# Patient Record
Sex: Female | Born: 1970 | Smoking: Never smoker
Health system: Southern US, Community
[De-identification: ages and names within clinical notes are randomized; demographics above are authoritative.]

## PROBLEM LIST (undated history)

## (undated) ENCOUNTER — Emergency Department (HOSPITAL_COMMUNITY): Payer: BC Managed Care – PPO | Source: Home / Self Care

## (undated) DIAGNOSIS — I341 Nonrheumatic mitral (valve) prolapse: Secondary | ICD-10-CM

## (undated) DIAGNOSIS — T7840XA Allergy, unspecified, initial encounter: Secondary | ICD-10-CM

## (undated) DIAGNOSIS — T300 Burn of unspecified body region, unspecified degree: Secondary | ICD-10-CM

## (undated) DIAGNOSIS — M1990 Unspecified osteoarthritis, unspecified site: Secondary | ICD-10-CM

## (undated) HISTORY — DX: Nonrheumatic mitral (valve) prolapse: I34.1

## (undated) HISTORY — DX: Allergy, unspecified, initial encounter: T78.40XA

## (undated) HISTORY — DX: Unspecified osteoarthritis, unspecified site: M19.90

## (undated) HISTORY — DX: Burn of unspecified body region, unspecified degree: T30.0

---

## 2012-01-25 ENCOUNTER — Ambulatory Visit (INDEPENDENT_AMBULATORY_CARE_PROVIDER_SITE_OTHER): Payer: BC Managed Care – PPO | Admitting: Internal Medicine

## 2012-01-25 VITALS — BP 122/79 | HR 73 | Temp 98.8°F | Resp 16 | Ht 65.78 in | Wt 172.2 lb

## 2012-01-25 DIAGNOSIS — J301 Allergic rhinitis due to pollen: Secondary | ICD-10-CM

## 2012-01-25 DIAGNOSIS — H00039 Abscess of eyelid unspecified eye, unspecified eyelid: Secondary | ICD-10-CM

## 2012-01-25 MED ORDER — FLUTICASONE PROPIONATE 50 MCG/ACT NA SUSP: 2.0000 | Freq: Every day | NASAL | Status: DC

## 2012-01-25 MED ORDER — CEPHALEXIN 500 MG PO CAPS: 500.0000 mg | ORAL_CAPSULE | Freq: Three times a day (TID) | ORAL | Status: AC

## 2012-01-25 NOTE — Progress Notes (Signed)
  Subjective:    Patient ID: Heather Atkinson, female    DOB: [birth year], 41 y.o.   MRN: [id number]  HPIHas a five-day history of congestion without fever/ cough/Sore throat/She does notice a fullness in the left ear History of spring allergies Has a two-day history of swollen tender left upper eyelid without discharge or redness in the eye    Review of Systems Noncontributory    Objective:   Physical Exam Vital signs normal TMs clear Nares boggy with clear rhinorrhea Throat is clear and there no anterior cervical nodes The left upper eyelid is red and swollen and tender without a distinct abscess formation or a pointing drainage       Assessment & Plan:  Problem #1 eyelid cellulitis Keflex 500 3 times a day for 10 days Hot compresses  Problem #2 allergic rhinitis Plan Flonase for 4 months

## 2012-02-03 ENCOUNTER — Telehealth: Payer: Self-pay

## 2012-02-03 NOTE — Telephone Encounter (Signed)
Pt is calling because dr Merla Riches put her cephalaxin and she is having some strange side effects would like a nurse to call her

## 2012-02-04 NOTE — Telephone Encounter (Signed)
PT STATED THAT SHE WAS HAVING SOME BREAK THRU BLEEDING AND THOUGHT IT MAY HAVE COME FROM KEFLEX.  ADVISED PT THAT IS NOT A USUAL SIDE EFFECT OF MEDICATION.  PT WILL CALL OB/GYN

## 2012-04-29 ENCOUNTER — Ambulatory Visit (INDEPENDENT_AMBULATORY_CARE_PROVIDER_SITE_OTHER): Payer: BC Managed Care – PPO | Admitting: Family Medicine

## 2012-04-29 VITALS — BP 118/82 | HR 83 | Temp 98.6°F | Resp 16 | Ht 66.75 in | Wt 162.0 lb

## 2012-04-29 DIAGNOSIS — J329 Chronic sinusitis, unspecified: Secondary | ICD-10-CM

## 2012-04-29 DIAGNOSIS — J019 Acute sinusitis, unspecified: Secondary | ICD-10-CM

## 2012-04-29 NOTE — Progress Notes (Signed)
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 He comes in with 2 days of myalgia, low-grade fever, facial pain across the malar arches. She has a history of sinus infections and this is typical for her. He's used Flonase in the past which has not helped her this time.  No cough, nausea, vomiting, or chance of pregnancy  Objective: No acute distress HEENT: Swollen nasal passages which are red and blind with mucopurulent discharge, otherwise normal Chest: Clear Heart: Regular no murmur Neck: No adenopathy, thyromegaly, stiffness Skin: Unremarkable  Assessment: Sinus infection, mild Plan: Levaquin 500 daily x7 and continue Flonase

## 2013-02-21 ENCOUNTER — Ambulatory Visit (INDEPENDENT_AMBULATORY_CARE_PROVIDER_SITE_OTHER): Payer: BC Managed Care – PPO | Admitting: Family Medicine

## 2013-02-21 VITALS — BP 108/72 | HR 94 | Temp 99.0°F | Resp 16 | Ht 67.0 in | Wt 164.0 lb

## 2013-02-21 DIAGNOSIS — T300 Burn of unspecified body region, unspecified degree: Secondary | ICD-10-CM

## 2013-02-21 HISTORY — DX: Burn of unspecified body region, unspecified degree: T30.0

## 2013-02-21 MED ORDER — SULFAMETHOXAZOLE-TRIMETHOPRIM 800-160 MG PO TABS: 1.0000 | ORAL_TABLET | Freq: Two times a day (BID) | ORAL | Status: DC

## 2013-02-21 NOTE — Progress Notes (Signed)
Heather Atkinson is a 42 y.o. female who presents to [facility] today for laser burn to her right forearm.  Patient was a skin spa one week ago he had laser therapy for age spots to her face and arms. Reportedly the device was set to high on her arm and she suffered a burn on her right forearm.  This was immediately apparent in the spa.  For the last week she's been using antibiotic ointment. She notes discharge and increased redness surrounding the burn and presents today for evaluation. She denies any fevers chills radiating pain weakness or numbness. She feels well otherwise.   PMH: Reviewed otherwise healthy History  Substance Use Topics  . Smoking status: Never Smoker   . Smokeless tobacco: Not on file  . Alcohol Use: Yes   ROS as above  Medications reviewed. Current Outpatient Prescriptions  Medication Sig Dispense Refill  . Multiple Vitamin (MULTIVITAMIN) tablet Take 1 tablet by mouth daily.      Marland Kitchen sulfamethoxazole-trimethoprim (BACTRIM DS,SEPTRA DS) 800-160 MG per tablet Take 1 tablet by mouth 2 (two) times daily.  20 tablet  0   No current facility-administered medications for this visit.    Exam:  BP 108/72  Pulse 94  Temp(Src) 99 F (37.2 C)  Resp 16  Ht 5\' 7"  (1.702 m)  Wt 164 lb (74.39 kg)  BMI 25.68 kg/m2  LMP 01/31/2013 Gen: Well NAD SKIN: Right forearm 2 cm by a less than 1 cm rectangular burn on right forearm. Slight exudate with surrounding erythema. The surrounding erythema is nonindurated and not tender. Normal distal sensation and motion.   No results found for this or any previous visit (from the past 72 hour(s)).  Assessment and Plan: 42 y.o. female with laser burn. Doubtful for surrounding cellulitis. I believe the erythema is due to reaction to Neosporin or adhesive from bandage.  Plan: Discontinue antibiotic ointment and start Vaseline. Nonadherent dressing without adhesive.  Prescription for Bactrim (tetracycline now) to use if skin erythema worsens  to treat cellulitis.  Followup as needed

## 2013-02-21 NOTE — Patient Instructions (Signed)
Thank you for coming in today. STOP using the antibiotic cream.  Use Vaseline to keep the wound covered.  Use non-adherent dressing.  If the redness gets worse start the antibiotics.  Come back as needed.

## 2013-02-24 ENCOUNTER — Telehealth: Payer: Self-pay

## 2013-02-24 MED ORDER — SILVER SULFADIAZINE 1 % EX CREA: TOPICAL_CREAM | Freq: Every day | CUTANEOUS | Status: DC

## 2013-02-24 NOTE — Telephone Encounter (Signed)
Called her to advise.  

## 2013-02-24 NOTE — Telephone Encounter (Signed)
Patient saw Dr. Neva Seat for a 2nd degree burn and would like to know if we can call in a cream for her. Please call [phone]

## 2013-02-24 NOTE — Telephone Encounter (Signed)
Sent in, however if erythema worsens or anything concerns the patient she is to RTC.

## 2013-02-24 NOTE — Telephone Encounter (Signed)
Was advised to use Vaseline, but she is requesting burn cream instead, please advise.

## 2013-03-21 ENCOUNTER — Ambulatory Visit: Payer: BC Managed Care – PPO

## 2013-03-21 ENCOUNTER — Ambulatory Visit (INDEPENDENT_AMBULATORY_CARE_PROVIDER_SITE_OTHER): Payer: BC Managed Care – PPO | Admitting: Family Medicine

## 2013-03-21 VITALS — BP 121/73 | HR 90 | Temp 98.9°F | Resp 16 | Ht 67.0 in | Wt 158.0 lb

## 2013-03-21 DIAGNOSIS — J309 Allergic rhinitis, unspecified: Secondary | ICD-10-CM

## 2013-03-21 DIAGNOSIS — M542 Cervicalgia: Secondary | ICD-10-CM

## 2013-03-21 DIAGNOSIS — M79645 Pain in left finger(s): Secondary | ICD-10-CM

## 2013-03-21 DIAGNOSIS — J029 Acute pharyngitis, unspecified: Secondary | ICD-10-CM

## 2013-03-21 DIAGNOSIS — T148XXA Other injury of unspecified body region, initial encounter: Secondary | ICD-10-CM

## 2013-03-21 DIAGNOSIS — M79609 Pain in unspecified limb: Secondary | ICD-10-CM

## 2013-03-21 DIAGNOSIS — R42 Dizziness and giddiness: Secondary | ICD-10-CM

## 2013-03-21 LAB — POCT CBC
Granulocyte percent: 68.9 % (ref 37–80)
HCT, POC: 44.5 % (ref 37.7–47.9)
Hemoglobin: 13.9 g/dL (ref 12.2–16.2)
Lymph, poc: 2.3 (ref 0.6–3.4)
MCH, POC: 29 pg (ref 27–31.2)
MCHC: 31.2 g/dL — AB (ref 31.8–35.4)
MCV: 92.8 fL (ref 80–97)
MID (cbc): 0.6 (ref 0–0.9)
MPV: 9.6 fL (ref 0–99.8)
POC Granulocyte: 6.3 (ref 2–6.9)
POC LYMPH PERCENT: 24.9 %L (ref 10–50)
POC MID %: 6.2 % (ref 0–12)
Platelet Count, POC: 319 10*3/uL (ref 142–424)
RBC: 4.79 M/uL (ref 4.04–5.48)
RDW, POC: 14 %
WBC: 9.1 10*3/uL (ref 4.6–10.2)

## 2013-03-21 LAB — POCT RAPID STREP A (OFFICE): Rapid Strep A Screen: NEGATIVE

## 2013-03-21 LAB — GLUCOSE, POCT (MANUAL RESULT ENTRY): POC Glucose: 78 mg/dl (ref 70–99)

## 2013-03-21 MED ORDER — NAPROXEN 500 MG PO TABS: 500.0000 mg | ORAL_TABLET | Freq: Two times a day (BID) | ORAL | Status: DC

## 2013-03-21 MED ORDER — FLUTICASONE PROPIONATE 50 MCG/ACT NA SUSP: 2.0000 | Freq: Every day | NASAL | Status: DC

## 2013-03-21 MED ORDER — CYCLOBENZAPRINE HCL 5 MG PO TABS: 5.0000 mg | ORAL_TABLET | Freq: Every evening | ORAL | Status: DC | PRN

## 2013-03-21 MED ORDER — TRAMADOL HCL 50 MG PO TABS: 50.0000 mg | ORAL_TABLET | Freq: Three times a day (TID) | ORAL | Status: DC | PRN

## 2013-03-21 NOTE — Progress Notes (Signed)
[facility]:  Office Visit  Chief Complaint:  Chief Complaint  Patient presents with  . Neck Pain    Left side since Sat  . Sore Throat    Left side since yesterday  . Finger Pain    Injured left middle finger in beginning Feb-still painful and unable to straighten finger-knuckle still swollen    HPI: Heather Atkinson is a 42 y.o. female who complains of:  1. Left neck pain since Saturday 4 day ago and also has had sore throat for the last 2 days , swallowing is painful  8/10. Achey pain in neck, she has had problems with stiffness, ROM. Has tried Advil neck with some releif but not throat. 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 + dizzy. No abd pain,  AMS, nausea/vomiting, HA, rash, photophobia, or other meningeal signs.Deneis fevers, chills. NKI. Does not think she slept wrong. No new activities. She has been wrokign out with weights but that has happened ed  before she started having neck pain. She works in the school sxs as a Engineer, structural for [employer].   2. Left middle finger pain s/p fall after fall in February in bathtub, pain on lateral side of middle finger worse than medial side at PIP joint . + sweling and pain at time of injury, now she has pain only when she touches it. Full ROM.  No pop or click.   Past Medical History  Diagnosis Date  . Allergy   . Arthritis   . Mitral valve prolapse syndrome    History reviewed. No pertinent past surgical history. History   Social History  . Marital Status: Unknown    Spouse Name: N/A    Number of Children: N/A  . Years of Education: N/A   Social History Main Topics  . Smoking status: Never Smoker   . Smokeless tobacco: None  . Alcohol Use: Yes  . Drug Use: No  . Sexually Active: No   Other Topics Concern  . None   Social History Narrative  . None   Family History  Problem Relation Age of Onset  . Breast cancer Mother   . Hypertension Brother   . Heart disease  Maternal Grandmother   . Breast cancer Paternal Grandmother   . Cancer Paternal Grandfather    Allergies  Allergen Reactions  . Tetracyclines & Related Nausea Only   Prior to Admission medications   Medication Sig Start Date End Date Taking? Authorizing Provider  Multiple Vitamin (MULTIVITAMIN) tablet Take 1 tablet by mouth daily.    Historical Provider, MD     ROS: The patient denies fevers, chills, night sweats, unintentional weight loss, chest pain, palpitations, wheezing, dyspnea on exertion, nausea, vomiting, abdominal pain, dysuria, hematuria, melena, numbness, weakness, or tingling.   All other systems have been reviewed and were otherwise negative with the exception of those mentioned in the HPI and as above.    PHYSICAL EXAM: Filed Vitals:   03/21/13 1745  BP: 121/73  Pulse: 90  Temp: 98.9 F (37.2 C)  Resp: 16   Filed Vitals:   03/21/13 1745  Height: 5\' 7"  (1.702 m)  Weight: 158 lb (71.668 kg)   Body mass index is 24.74 kg/(m^2).  General: Alert, no acute distress HEENT:  Normocephalic, atraumatic, oropharynx patent. EOMI, PERRLA, fundoscopic exam nl.  Cardiovascular:  Regular rate and rhythm, no rubs murmurs or gallops.  No Carotid bruits, radial pulse intact. No pedal edema.  Respiratory: Clear to auscultation bilaterally.  No wheezes, rales, or rhonchi.  No cyanosis, no use of accessory musculature GI: No organomegaly, abdomen is soft and non-tender, positive bowel sounds.  No masses. Skin: No rashes. Neurologic: Facial musculature symmetric. CN 2-12 grossly normal. No meningeal signs. Nuchal rigidity is nl.  Psychiatric: Patient is appropriate throughout our interaction. Lymphatic: No cervical lymphadenopathy Musculoskeletal: Gait intact. Head normal.  Neck-decreae ROM in left lateral rotation . Flexion normal, Extension nl. SB limited.  5/5 strength, sensation intact  LABS: Results for orders placed in visit on 03/21/13  POCT RAPID STREP A (OFFICE)       Result Value Range   Rapid Strep A Screen Negative  Negative  POCT CBC      Result Value Range   WBC 9.1  4.6 - 10.2 K/uL   Lymph, poc 2.3  0.6 - 3.4   POC LYMPH PERCENT 24.9  10 - 50 %L   MID (cbc) 0.6  0 - 0.9   POC MID % 6.2  0 - 12 %M   POC Granulocyte 6.3  2 - 6.9   Granulocyte percent 68.9  37 - 80 %G   RBC 4.79  4.04 - 5.48 M/uL   Hemoglobin 13.9  12.2 - 16.2 g/dL   HCT, POC 46.9  62.9 - 47.9 %   MCV 92.8  80 - 97 fL   MCH, POC 29.0  27 - 31.2 pg   MCHC 31.2 (*) 31.8 - 35.4 g/dL   RDW, POC 52.8     Platelet Count, POC 319  142 - 424 K/uL   MPV 9.6  0 - 99.8 fL  GLUCOSE, POCT (MANUAL RESULT ENTRY)      Result Value Range   POC Glucose 78  70 - 99 mg/dl     EKG/XRAY:   Primary read interpreted by Dr. Conley Rolls at [facility]. c spine-no fx or dislocation Left middle finger-no fx or dislocation   ASSESSMENT/PLAN: Encounter Diagnoses  Name Primary?  . Neck pain Yes  . Pain in finger of left hand   . Pharyngitis   . Dizziness and giddiness    No e/o menignitis.  Precautions given.  Most likely sprain/strain.  Will try flexeril 5 mg nightly Will try naproxen 500 mg BID with food Will try Tramadol for break through pain. Risk and benefits of meds given. She did become dizzy and had pre-syncope sxs after blood draw. She has a h.o this with blood work when she was younger. VSS when we took her BP, HR, O2 sat after incident, Slightly low sugars with glucose of 78 . Most likely vasovagal response F/u  Prn or in 1 week     Eligah Anello PHUONG, DO 03/21/2013 7:08 PM

## 2013-04-10 ENCOUNTER — Other Ambulatory Visit: Payer: Self-pay | Admitting: Obstetrics

## 2013-04-10 DIAGNOSIS — Z1231 Encounter for screening mammogram for malignant neoplasm of breast: Secondary | ICD-10-CM

## 2013-05-01 ENCOUNTER — Ambulatory Visit: Payer: Self-pay

## 2013-05-11 ENCOUNTER — Ambulatory Visit
Admission: RE | Admit: 2013-05-11 | Discharge: 2013-05-11 | Disposition: A | Discharge status: Home / Self Care | Payer: BC Managed Care – PPO | Source: Ambulatory Visit | Attending: Obstetrics | Admitting: Obstetrics

## 2013-05-11 DIAGNOSIS — Z1231 Encounter for screening mammogram for malignant neoplasm of breast: Secondary | ICD-10-CM

## 2013-05-24 ENCOUNTER — Ambulatory Visit (INDEPENDENT_AMBULATORY_CARE_PROVIDER_SITE_OTHER): Payer: BC Managed Care – PPO | Admitting: Physician Assistant

## 2013-05-24 VITALS — BP 106/70 | HR 81 | Temp 98.2°F | Resp 18 | Ht 67.0 in | Wt 152.0 lb

## 2013-05-24 DIAGNOSIS — R1030 Lower abdominal pain, unspecified: Secondary | ICD-10-CM

## 2013-05-24 DIAGNOSIS — R300 Dysuria: Secondary | ICD-10-CM

## 2013-05-24 DIAGNOSIS — R350 Frequency of micturition: Secondary | ICD-10-CM

## 2013-05-24 DIAGNOSIS — R109 Unspecified abdominal pain: Secondary | ICD-10-CM

## 2013-05-24 LAB — POCT WET PREP WITH KOH
KOH Prep POC: NEGATIVE
Trichomonas, UA: NEGATIVE
Yeast Wet Prep HPF POC: NEGATIVE

## 2013-05-24 LAB — POCT URINALYSIS DIPSTICK
Bilirubin, UA: NEGATIVE
Glucose, UA: NEGATIVE
Ketones, UA: NEGATIVE
Nitrite, UA: NEGATIVE
Protein, UA: NEGATIVE
Spec Grav, UA: 1.015
Urobilinogen, UA: 0.2
pH, UA: 5.5

## 2013-05-24 LAB — POCT UA - MICROSCOPIC ONLY
Casts, Ur, LPF, POC: NEGATIVE
Crystals, Ur, HPF, POC: NEGATIVE
Mucus, UA: NEGATIVE
Yeast, UA: NEGATIVE

## 2013-05-24 MED ORDER — CIPROFLOXACIN HCL 500 MG PO TABS: 500.0000 mg | ORAL_TABLET | Freq: Two times a day (BID) | ORAL | Status: DC

## 2013-05-24 NOTE — Progress Notes (Signed)
Subjective:    Patient ID: Heather Atkinson, female    DOB: [birth year], 42 y.o.   MRN: [id number]  HPI 42 year old female presents with acute onset of urinary frequency, dysuria, and urinary hesitation.  States symptoms started yesterday morning and were present throughout the day yesterday.  She does admit her symptoms have improved slightly today, but she felt so bad yesterday that she wanted to get checked out.  Has slight suprapubic tenderness but denies nausea, vomiting, fever, chills, vaginal discharge, or odor.  Does admit she has a new sexual partner but denies any specific concern for STI's.  States she was tested prior to this relationship and he told her he has not had any partners in 1 year.   Patient is otherwise healthy with no other concerns today.     Review of Systems  Constitutional: Negative for fever and chills.  Respiratory: Negative for cough.   Gastrointestinal: Positive for abdominal pain (suprapubic). Negative for nausea and vomiting.  Genitourinary: Positive for dysuria, frequency and difficulty urinating (hesitancy). Negative for flank pain, vaginal discharge and vaginal pain.  Neurological: Negative for headaches.       Objective:   Physical Exam  Constitutional: She is oriented to person, place, and time. She appears well-developed and well-nourished.  HENT:  Head: Normocephalic and atraumatic.  Right Ear: External ear normal.  Left Ear: External ear normal.  Eyes: Conjunctivae are normal.  Neck: Normal range of motion.  Cardiovascular: Normal rate.   Pulmonary/Chest: Effort normal.  Genitourinary: Vagina normal and uterus normal. Pelvic exam was performed with patient supine. There is no rash, tenderness or lesion on the right labia. There is no rash, tenderness or lesion on the left labia. Cervix exhibits discharge (scant bloody discharge). Cervix exhibits no motion tenderness and no friability. Right adnexum displays no mass, no tenderness and no  fullness. Left adnexum displays no mass, no tenderness and no fullness.  Neurological: She is alert and oriented to person, place, and time.  Psychiatric: She has a normal mood and affect. Her behavior is normal. Judgment and thought content normal.     Results for orders placed in visit on 05/24/13  POCT URINALYSIS DIPSTICK      Result Value Range   Color, UA yellow     Clarity, UA clear     Glucose, UA neg     Bilirubin, UA neg     Ketones, UA neg     Spec Grav, UA 1.015     Blood, UA small     pH, UA 5.5     Protein, UA neg     Urobilinogen, UA 0.2     Nitrite, UA neg     Leukocytes, UA small (1+)    POCT UA - MICROSCOPIC ONLY      Result Value Range   WBC, Ur, HPF, POC tntc     RBC, urine, microscopic 0-4     Bacteria, U Microscopic 3+     Mucus, UA neg     Epithelial cells, urine per micros 4-8     Crystals, Ur, HPF, POC neg     Casts, Ur, LPF, POC neg     Yeast, UA neg    POCT WET PREP WITH KOH      Result Value Range   Trichomonas, UA Negative     Clue Cells Wet Prep HPF POC 0-3     Epithelial Wet Prep HPF POC 3-8     Yeast Wet Prep HPF POC neg  Bacteria Wet Prep HPF POC 2+     RBC Wet Prep HPF POC 0-1     WBC Wet Prep HPF POC 2-3     KOH Prep POC Negative          Assessment & Plan:  Urinary frequency - Plan: POCT urinalysis dipstick, POCT UA - Microscopic Only, Urine culture, ciprofloxacin (CIPRO) 500 MG tablet  Dysuria - Plan: POCT urinalysis dipstick, POCT UA - Microscopic Only, POCT Wet Prep with KOH  Abdominal pain, suprapubic, unspecified laterality - Plan: POCT Wet Prep with KOH, GC/Chlamydia Probe Amp  Labs reassuring today. Wet prep clear. Genprobe and urine culture pending Will go ahead and treat empirically with Cipro 500 mg bid x 3 days  Return or go to ER with any changing or worsening symptoms.

## 2013-05-25 LAB — GC/CHLAMYDIA PROBE AMP
CT Probe RNA: NEGATIVE
GC Probe RNA: NEGATIVE

## 2013-05-26 LAB — URINE CULTURE: Colony Count: >=100000

## 2013-10-17 ENCOUNTER — Other Ambulatory Visit: Payer: Self-pay

## 2013-10-17 MED ORDER — FLUTICASONE PROPIONATE 50 MCG/ACT NA SUSP: 2.0000 | Freq: Every day | NASAL | Status: DC

## 2013-11-09 ENCOUNTER — Ambulatory Visit (INDEPENDENT_AMBULATORY_CARE_PROVIDER_SITE_OTHER): Payer: BC Managed Care – PPO | Admitting: Physician Assistant

## 2013-11-09 VITALS — BP 122/64 | HR 111 | Temp 100.6°F | Resp 16 | Ht 67.25 in | Wt 154.6 lb

## 2013-11-09 DIAGNOSIS — R059 Cough, unspecified: Secondary | ICD-10-CM

## 2013-11-09 DIAGNOSIS — R05 Cough: Secondary | ICD-10-CM

## 2013-11-09 DIAGNOSIS — J101 Influenza due to other identified influenza virus with other respiratory manifestations: Secondary | ICD-10-CM

## 2013-11-09 DIAGNOSIS — R509 Fever, unspecified: Secondary | ICD-10-CM

## 2013-11-09 DIAGNOSIS — J029 Acute pharyngitis, unspecified: Secondary | ICD-10-CM

## 2013-11-09 DIAGNOSIS — J111 Influenza due to unidentified influenza virus with other respiratory manifestations: Secondary | ICD-10-CM

## 2013-11-09 LAB — POCT INFLUENZA A/B
Influenza A, POC: POSITIVE
Influenza B, POC: NEGATIVE

## 2013-11-09 LAB — POCT RAPID STREP A (OFFICE): Rapid Strep A Screen: NEGATIVE

## 2013-11-09 MED ORDER — HYDROCOD POLST-CHLORPHEN POLST 10-8 MG/5ML PO LQCR: 5.0000 mL | Freq: Two times a day (BID) | ORAL | Status: DC | PRN

## 2013-11-09 MED ORDER — OSELTAMIVIR PHOSPHATE 75 MG PO CAPS: 75.0000 mg | ORAL_CAPSULE | Freq: Two times a day (BID) | ORAL | Status: DC

## 2013-11-09 NOTE — Patient Instructions (Addendum)

## 2013-11-09 NOTE — Progress Notes (Signed)
   Subjective:    Patient ID: Heather Atkinson, female    DOB: 1971/03/19, 42 y.o.   MRN: [id number]  HPI 42 year old female presents for evaluation of nasal congestion, sore throat, fever, chills, slight cough, and  fatigue. States for the past week she has been experiencing "cold" symptoms, but those were improving but then on 12/14 she noticed her throat was hurting more than it had been.  Over the next 2 days progressively felt worse. Is not sure when she developed a fever, but does think that it was yesterday.  Has been using Flonase and Mucinex.  She does not think these have been helping much.  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 She did not get a flu shot this year.  Patient is otherwise healthy with no other concerns today.     Review of Systems  Constitutional: Positive for fever, chills and fatigue.  HENT: Positive for congestion and sore throat. Negative for ear pain, postnasal drip, rhinorrhea and sinus pressure.   Respiratory: Positive for cough. Negative for shortness of breath and wheezing.   Gastrointestinal: Negative for nausea, vomiting and abdominal pain.  Neurological: Negative for dizziness and headaches.       Objective:   Physical Exam  Constitutional: She is oriented to person, place, and time. She appears well-developed and well-nourished.  HENT:  Head: Normocephalic and atraumatic.  Right Ear: External ear normal.  Left Ear: External ear normal.  Eyes: Conjunctivae are normal.  Neck: Normal range of motion. Neck supple.  Cardiovascular: Normal rate.   Pulmonary/Chest: Effort normal.  Lymphadenopathy:    She has no cervical adenopathy.  Neurological: She is alert and oriented to person, place, and time.  Psychiatric: She has a normal mood and affect. Her behavior is normal. Judgment and thought content normal.    Results for orders placed in visit on 11/09/13  POCT RAPID  STREP A (OFFICE)      Result Value Range   Rapid Strep A Screen Negative  Negative  POCT INFLUENZA A/B      Result Value Range   Influenza A, POC Positive     Influenza B, POC Negative      Ibuprofen 600 mg given today in the office.      Assessment & Plan:  Influenza A - Plan: oseltamivir (TAMIFLU) 75 MG capsule  Fever, unspecified - Plan: POCT Influenza A/B  Acute pharyngitis - Plan: POCT rapid strep A, Culture, Group A Strep  Cough - Plan: chlorpheniramine-HYDROcodone (TUSSIONEX PENNKINETIC ER) 10-8 MG/5ML LQCR  Will start Tamiflu 75 mg bid x 5 days Tussionex q12hours as needed for cough - caution sedation Continue ibuprofen and tylenol as needed for fever and body aches Increase fluids and rest Follow up if symptoms worsen or fail to improve.

## 2013-11-11 LAB — CULTURE, GROUP A STREP: Organism ID, Bacteria: NORMAL

## 2014-04-30 ENCOUNTER — Other Ambulatory Visit: Payer: Self-pay

## 2014-04-30 DIAGNOSIS — Z1231 Encounter for screening mammogram for malignant neoplasm of breast: Secondary | ICD-10-CM

## 2014-05-15 ENCOUNTER — Ambulatory Visit
Admission: RE | Admit: 2014-05-15 | Discharge: 2014-05-15 | Disposition: A | Discharge status: Home / Self Care | Payer: BC Managed Care – PPO | Source: Ambulatory Visit

## 2014-05-15 DIAGNOSIS — Z1231 Encounter for screening mammogram for malignant neoplasm of breast: Secondary | ICD-10-CM

## 2015-10-07 ENCOUNTER — Ambulatory Visit (INDEPENDENT_AMBULATORY_CARE_PROVIDER_SITE_OTHER): Payer: BC Managed Care – PPO | Admitting: Family Medicine

## 2015-10-07 VITALS — BP 118/78 | HR 88 | Temp 98.7°F | Resp 16 | Ht 67.25 in | Wt 178.8 lb

## 2015-10-07 DIAGNOSIS — J0101 Acute recurrent maxillary sinusitis: Secondary | ICD-10-CM

## 2015-10-07 DIAGNOSIS — J209 Acute bronchitis, unspecified: Secondary | ICD-10-CM | POA: Diagnosis not present

## 2015-10-07 MED ORDER — AMOXICILLIN-POT CLAVULANATE 875-125 MG PO TABS: 1.0000 | ORAL_TABLET | Freq: Two times a day (BID) | ORAL | Status: DC

## 2015-10-07 MED ORDER — HYDROCODONE-HOMATROPINE 5-1.5 MG/5ML PO SYRP: 5.0000 mL | ORAL_SOLUTION | Freq: Three times a day (TID) | ORAL | Status: DC | PRN

## 2015-10-07 NOTE — Progress Notes (Signed)
This chart was scribed for Elvina Sidle, MD by Stann Ore, medical scribe at [facility].The patient was seen in exam room 1 and the patient's care was started at 1:03 PM.  Patient ID: Heather Atkinson MRN: [id number], DOB: August 08, 1971, 44 y.o. Date of Encounter: 10/07/2015  Primary Physician: No primary care provider on file.  Chief Complaint:  Chief Complaint  Patient presents with   Sinusitis    x 1 week   Cough    mucus yellow x 3 days   Generalized Body Aches    x 1 week   Sore Throat    HPI:  Heather Atkinson is a 44 y.o. female who presents to [facility] complaining of productive cough with sinus pressure, sore throat and general myalgia that started 9 days ago. She's been taking tylenol and some OTC cold medication without improvement. When she lived in [place], her colds were worse than when she has it currently. She mentions the coughs would keep her up at night.   She works at [employer]. She teaches ESOL.   Past Medical History  Diagnosis Date   Allergy    Arthritis    Mitral valve prolapse syndrome      Home Meds: Prior to Admission medications   Medication Sig Start Date End Date Taking? Authorizing Provider  cetirizine (ZYRTEC) 10 MG tablet Take 10 mg by mouth daily.    Historical Provider, MD  chlorpheniramine-HYDROcodone (TUSSIONEX PENNKINETIC ER) 10-8 MG/5ML LQCR Take 5 mLs by mouth every 12 (twelve) hours as needed for cough (cough). 11/09/13   Heather Jaquita Rector, PA-C  ciprofloxacin (CIPRO) 500 MG tablet Take 1 tablet (500 mg total) by mouth 2 (two) times daily. 05/24/13   Heather Jaquita Rector, PA-C  cyclobenzaprine (FLEXERIL) 5 MG tablet Take 1 tablet (5 mg total) by mouth at bedtime as needed for muscle spasms. 03/21/13   Thao P Le, DO  fluticasone (FLONASE) 50 MCG/ACT nasal spray Place 2 sprays into both nostrils daily. Marland Kitchen 10/17/13   Thao P Le, DO  Multiple Vitamin (MULTIVITAMIN)  tablet Take 1 tablet by mouth daily.    Historical Provider, MD  naproxen (NAPROSYN) 500 MG tablet Take 1 tablet (500 mg total) by mouth 2 (two) times daily with a meal. 03/21/13   Thao P Le, DO  oseltamivir (TAMIFLU) 75 MG capsule Take 1 capsule (75 mg total) by mouth 2 (two) times daily. 11/09/13   Nelva Nay, PA-C  traMADol (ULTRAM) 50 MG tablet Take 1 tablet (50 mg total) by mouth every 8 (eight) hours as needed for pain. For severe pain 03/21/13   Thao P Le, DO    Allergies:  Allergies  Allergen Reactions   Tetracyclines & Related Nausea Only    Social History   Social History   Marital Status: Unknown    Spouse Name: N/A   Number of Children: N/A   Years of Education: N/A   Occupational History   Not on file.   Social History Main Topics   Smoking status: Never Smoker    Smokeless tobacco: Not on file   Alcohol Use: Yes   Drug Use: No   Sexual Activity: No   Other Topics Concern   Not on file   Social History Narrative     Review of Systems: Constitutional: negative for fever, chills, night sweats, weight changes, or fatigue  HEENT: negative for vision changes, hearing loss, congestion, rhinorrhea, epistaxis; positive for sinus pressure, sore  throat Cardiovascular: negative for chest pain or palpitations Respiratory: negative for hemoptysis, wheezing, shortness of breath; positive for cough Abdominal: negative for abdominal pain, nausea, vomiting, diarrhea, or constipation Dermatological: negative for rash Neurologic: negative for headache, dizziness, or syncope Musc: positive for myalgia (general) All other systems reviewed and are otherwise negative with the exception to those above and in the HPI.  Physical Exam: Blood pressure 118/78, pulse 88, temperature 98.7 F (37.1 C), temperature source Oral, resp. rate 16, height 5' 7.25" (1.708 m), weight 178 lb 12.8 oz (81.103 kg), last menstrual period 10/05/2015, SpO2 99 %., Body mass index is 27.8  kg/(m^2). General: Well developed, well nourished, in no acute distress. Head: Normocephalic, atraumatic, eyes without discharge, sclera non-icteric, Bilateral auditory canals clear, TM's are without perforation, pearly grey and translucent with reflective cone of light bilaterally. Oral cavity moist, posterior pharynx without exudate, erythema, peritonsillar abscess, or post nasal drip. Nasal passage swollen with purulent mucus discharge Neck: Supple. No thyromegaly. Full ROM. No lymphadenopathy. Lungs: Clear bilaterally to auscultation without wheezes, rales. Few rhonchi Heart: RRR with S1 S2. No murmurs, rubs, or gallops appreciated. Msk:  Strength and tone normal for age. Extremities/Skin: Warm and dry. No clubbing or cyanosis. No edema. No rashes or suspicious lesions. Neuro: Alert and oriented X 3. Moves all extremities spontaneously. Gait is normal. CNII-XII grossly in tact. Psych:  Responds to questions appropriately with a normal affect.     ASSESSMENT AND PLAN:  44 y.o. year old female with  This chart was scribed in my presence and reviewed by me personally.    ICD-9-CM ICD-10-CM   1. Recurrent maxillary sinusitis, unspecified chronicity 461.0 J01.01 amoxicillin-clavulanate (AUGMENTIN) 875-125 MG tablet  2. Acute bronchitis, unspecified organism 466.0 J20.9 amoxicillin-clavulanate (AUGMENTIN) 875-125 MG tablet     HYDROcodone-homatropine (HYCODAN) 5-1.5 MG/5ML syrup     By signing my name below, I, Stann Oresung-Kai Tsai, attest that this documentation has been prepared under the direction and in the presence of Elvina SidleKurt Lauenstein, MD. Electronically Signed: Stann Oresung-Kai Tsai, Scribe. 10/07/2015 , 1:03 PM .  Signed, Elvina SidleKurt Lauenstein, MD 10/07/2015 1:03 PM

## 2015-10-07 NOTE — Patient Instructions (Signed)
Sinusitis, Adult °Sinusitis is redness, soreness, and inflammation of the paranasal sinuses. Paranasal sinuses are air pockets within the bones of your face. They are located beneath your eyes, in the middle of your forehead, and above your eyes. In healthy paranasal sinuses, mucus is able to drain out, and air is able to circulate through them by way of your nose. However, when your paranasal sinuses are inflamed, mucus and air can become trapped. This can allow bacteria and other germs to grow and cause infection. °Sinusitis can develop quickly and last only a short time (acute) or continue over a long period (chronic). Sinusitis that lasts for more than 12 weeks is considered chronic. °CAUSES °Causes of sinusitis include: °· Allergies. °· Structural abnormalities, such as displacement of the cartilage that separates your nostrils (deviated septum), which can decrease the air flow through your nose and sinuses and affect sinus drainage. °· Functional abnormalities, such as when the small hairs (cilia) that line your sinuses and help remove mucus do not work properly or are not present. °SIGNS AND SYMPTOMS °Symptoms of acute and chronic sinusitis are the same. The primary symptoms are pain and pressure around the affected sinuses. Other symptoms include: °· Upper toothache. °· Earache. °· Headache. °· Bad breath. °· Decreased sense of smell and taste. °· A cough, which worsens when you are lying flat. °· Fatigue. °· Fever. °· Thick drainage from your nose, which often is green and may contain pus (purulent). °· Swelling and warmth over the affected sinuses. °DIAGNOSIS °Your health care provider will perform a physical exam. During your exam, your health care provider may perform any of the following to help determine if you have acute sinusitis or chronic sinusitis: °· Look in your nose for signs of abnormal growths in your nostrils (nasal polyps). °· Tap over the affected sinus to check for signs of  infection. °· View the inside of your sinuses using an imaging device that has a light attached (endoscope). °If your health care provider suspects that you have chronic sinusitis, one or more of the following tests may be recommended: °· Allergy tests. °· Nasal culture. A sample of mucus is taken from your nose, sent to a lab, and screened for bacteria. °· Nasal cytology. A sample of mucus is taken from your nose and examined by your health care provider to determine if your sinusitis is related to an allergy. °TREATMENT °Most cases of acute sinusitis are related to a viral infection and will resolve on their own within 10 days. Sometimes, medicines are prescribed to help relieve symptoms of both acute and chronic sinusitis. These may include pain medicines, decongestants, nasal steroid sprays, or saline sprays. °However, for sinusitis related to a bacterial infection, your health care provider will prescribe antibiotic medicines. These are medicines that will help kill the bacteria causing the infection. °Rarely, sinusitis is caused by a fungal infection. In these cases, your health care provider will prescribe antifungal medicine. °For some cases of chronic sinusitis, surgery is needed. Generally, these are cases in which sinusitis recurs more than 3 times per year, despite other treatments. °HOME CARE INSTRUCTIONS °· Drink plenty of water. Water helps thin the mucus so your sinuses can drain more easily. °· Use a humidifier. °· Inhale steam 3-4 times a day (for example, sit in the bathroom with the shower running). °· Apply a warm, moist washcloth to your face 3-4 times a day, or as directed by your health care provider. °· Use saline nasal sprays to help   moisten and clean your sinuses. °· Take medicines only as directed by your health care provider. °· If you were prescribed either an antibiotic or antifungal medicine, finish it all even if you start to feel better. °SEEK IMMEDIATE MEDICAL CARE IF: °· You have  increasing pain or severe headaches. °· You have nausea, vomiting, or drowsiness. °· You have swelling around your face. °· You have vision problems. °· You have a stiff neck. °· You have difficulty breathing. °  °This information is not intended to replace advice given to you by your health care provider. Make sure you discuss any questions you have with your health care provider. °  °Document Released: 11/09/2005 Document Revised: 11/30/2014 Document Reviewed: 11/24/2011 °Elsevier Interactive Patient Education ©2016 [employer] ° °Acute Bronchitis °Bronchitis is inflammation of the airways that extend from the windpipe into the lungs (bronchi). The inflammation often causes mucus to develop. This leads to a cough, which is the most common symptom of bronchitis.  °In acute bronchitis, the condition usually develops suddenly and goes away over time, usually in a couple weeks. Smoking, allergies, and asthma can make bronchitis worse. Repeated episodes of bronchitis may cause further lung problems.  °CAUSES °Acute bronchitis is most often caused by the same virus that causes a cold. The virus can spread from person to person (contagious) through coughing, sneezing, and touching contaminated objects. °SIGNS AND SYMPTOMS  °· Cough.   °· Fever.   °· Coughing up mucus.   °· Body aches.   °· Chest congestion.   °· Chills.   °· Shortness of breath.   °· Sore throat.   °DIAGNOSIS  °Acute bronchitis is usually diagnosed through a physical exam. Your health care provider will also ask you questions about your medical history. Tests, such as chest X-rays, are sometimes done to rule out other conditions.  °TREATMENT  °Acute bronchitis usually goes away in a couple weeks. Oftentimes, no medical treatment is necessary. Medicines are sometimes given for relief of fever or cough. Antibiotic medicines are usually not needed but may be prescribed in certain situations. In some cases, an inhaler may be recommended to help reduce  shortness of breath and control the cough. A cool mist vaporizer may also be used to help thin bronchial secretions and make it easier to clear the chest.  °HOME CARE INSTRUCTIONS °· Get plenty of rest.   °· Drink enough fluids to keep your urine clear or pale yellow (unless you have a medical condition that requires fluid restriction). Increasing fluids may help thin your respiratory secretions (sputum) and reduce chest congestion, and it will prevent dehydration.   °· Take medicines only as directed by your health care provider. °· If you were prescribed an antibiotic medicine, finish it all even if you start to feel better. °· Avoid smoking and secondhand smoke. Exposure to cigarette smoke or irritating chemicals will make bronchitis worse. If you are a smoker, consider using nicotine gum or skin patches to help control withdrawal symptoms. Quitting smoking will help your lungs heal faster.   °· Reduce the chances of another bout of acute bronchitis by washing your hands frequently, avoiding people with cold symptoms, and trying not to touch your hands to your mouth, nose, or eyes.   °· Keep all follow-up visits as directed by your health care provider.   °SEEK MEDICAL CARE IF: °Your symptoms do not improve after 1 week of treatment.  °SEEK IMMEDIATE MEDICAL CARE IF: °· You develop an increased fever or chills.   °· You have chest pain.   °· You have severe shortness of   breath. °· You have bloody sputum.   °· You develop dehydration. °· You faint or repeatedly feel like you are going to pass out. °· You develop repeated vomiting. °· You develop a severe headache. °MAKE SURE YOU:  °· Understand these instructions. °· Will watch your condition. °· Will get help right away if you are not doing well or get worse. °  °This information is not intended to replace advice given to you by your health care provider. Make sure you discuss any questions you have with your health care provider. °  °Document Released:  12/17/2004 Document Revised: 11/30/2014 Document Reviewed: 05/02/2013 °Elsevier Interactive Patient Education ©2016 Elsevier Inc. ° °

## 2015-10-07 NOTE — Addendum Note (Signed)
Addended by: Elvina SidleLAUENSTEIN, Yulissa Needham on: 10/07/2015 01:25 PM   Modules accepted: Orders

## 2016-01-08 ENCOUNTER — Ambulatory Visit (INDEPENDENT_AMBULATORY_CARE_PROVIDER_SITE_OTHER): Payer: BC Managed Care – PPO | Admitting: Physician Assistant

## 2016-01-08 ENCOUNTER — Ambulatory Visit (INDEPENDENT_AMBULATORY_CARE_PROVIDER_SITE_OTHER): Payer: BC Managed Care – PPO

## 2016-01-08 VITALS — BP 115/77 | HR 81 | Temp 98.1°F | Resp 16 | Ht 67.0 in | Wt 184.4 lb

## 2016-01-08 DIAGNOSIS — J302 Other seasonal allergic rhinitis: Secondary | ICD-10-CM | POA: Diagnosis not present

## 2016-01-08 DIAGNOSIS — Z23 Encounter for immunization: Secondary | ICD-10-CM

## 2016-01-08 DIAGNOSIS — S93401A Sprain of unspecified ligament of right ankle, initial encounter: Secondary | ICD-10-CM

## 2016-01-08 DIAGNOSIS — S82831A Other fracture of upper and lower end of right fibula, initial encounter for closed fracture: Secondary | ICD-10-CM

## 2016-01-08 MED ORDER — FLUTICASONE PROPIONATE 50 MCG/ACT NA SUSP: 2.0000 | Freq: Every day | NASAL | Status: DC

## 2016-01-08 NOTE — Progress Notes (Signed)
Subjective:    Patient ID: Heather Atkinson, female    DOB: 07/03/1971, 45 y.o.   MRN: [id number]  HPI  [name] is a 45 year old Caucasian female who presents today with a 3 day history of R ankle pain. She reports that on her birthday [birth year], [birth year], she was walking down the street on uneven pavement in [place] after 2 glasses wine with lunch. She states she was drinking coffee, checking phone, and not paying attention when she inverted her ankle. At which point she experienced searing pain 6-7/10. The ankle immediately started to swell and became softball size. She went back to where she was staying where she rested, iced, and elevated her ankle. She took 2 aleve which helped make the pain managable. She had pain and difficulty walking and used a wheelchair in the airport to get home. She reports bruising developed over next few days on her ankle and foot and that the swelling remained constant. She also states that her right lower leg feels like it is swollen too. She states her pain now is a 5/10.    During the exam the patient also reported continued seasonal allergy symptoms and requested refill on her flonase.   PMH, FH, SH were reviewed with patient.   Allergies  Allergen Reactions  . Tetracyclines & Related Nausea Only   Prior to Admission medications   Medication Sig Start Date End Date Taking? Authorizing Provider  cetirizine (ZYRTEC) 10 MG tablet Take 10 mg by mouth daily.   Yes Historical Provider, MD  Multiple Vitamin (MULTIVITAMIN) tablet Take 1 tablet by mouth daily.   Yes Historical Provider, MD  fluticasone (FLONASE) 50 MCG/ACT nasal spray Place 2 sprays into both nostrils daily. . Patient not taking: Reported on 10/07/2015 10/17/13   Thao P Le, DO    Review of Systems  Constitutional: Positive for fatigue. Negative for fever and chills.  HENT: Positive for congestion and sneezing. Negative for hearing loss.   Eyes: Negative for visual disturbance.  Respiratory: Negative  for cough and shortness of breath.   Cardiovascular: Negative for chest pain.  Gastrointestinal: Negative for nausea, vomiting, abdominal pain, diarrhea and constipation.  Genitourinary: Negative for dysuria and frequency.  Musculoskeletal: Positive for joint swelling (right ankle and right lower leg) and gait problem (difficulty/pain with walking).  Allergic/Immunologic: Positive for environmental allergies.  Neurological: Negative for dizziness, light-headedness and headaches.       Objective:   Physical Exam  Constitutional: She is oriented to person, place, and time. She appears well-developed and well-nourished. No distress.  HENT:  Head: Normocephalic and atraumatic.  Eyes: Pupils are equal, round, and reactive to light.  Neck: Neck supple. No thyromegaly present.  Cardiovascular: Normal rate and regular rhythm.   Murmur (a click was heard on auscultation consistent with PMH of MVP) heard. Pulmonary/Chest: Effort normal. She has no wheezes. She has no rales.  Musculoskeletal:       Right ankle: She exhibits decreased range of motion, swelling and ecchymosis. She exhibits normal pulse. Tenderness. Lateral malleolus tenderness found.  Swelling of R ankle around the medial and lateral malleolus. Ecchymosis noted most prominently along the lateral malleolus as well as along the lateral edge of the foot but was seen wrapping around the foot along the medial malleolus. Patient had pain and difficulty with rotation, lateral, and medial rotation of the right food.   Lymphadenopathy:    She has no cervical adenopathy.  Neurological: She is alert and oriented to person, place,  and time.  Reflex Scores:      Patellar reflexes are 2+ on the right side and 2+ on the left side. Skin: Skin is warm and dry.  Psychiatric: She has a normal mood and affect. Her speech is normal and behavior is normal.      Assessment & Plan:  1. Sprain of ankle, right, initial encounter 4. Closed avulsion  fracture of distal fibula, right, initial encounter Xray is read with closed avulsion fracture of the distal fibula but it is unclear wether it is acute or chronic so we will treat it as acute. She will be made non weight bearing for 1 week with crutches after which she will be weight bearing as tolerated. Refer for physical therapy to help strengthen and recondition the ankle in effort to prevent future injury.  - DG Ankle Complete Right; Future - Ambulatory referral to Physical Therapy  2. Seasonal allergies Will refill her flonase to help with her seasonal allergies.  - fluticasone (FLONASE) 50 MCG/ACT nasal spray; Place 2 sprays into both nostrils daily. .  Dispense: 16 g; Refill: 5  3. Need for influenza vaccination Will administer the flu vaccine.  - Flu Vaccine QUAD 36+ mos IM  S. Lysle Dingwall PA-S [facility]
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
## 2016-01-08 NOTE — Patient Instructions (Addendum)
Because you received an x-ray today, you will receive an invoice from [facility]. Please contact [facility] at [phone] with questions or concerns regarding your invoice. Our billing staff will not be able to assist you with those questions.  Because it appears that there is a small bone fragment pulled away from the fibula, we need to treat this as a fracture. The fibula is a Non-weight bearing bone, and these fractures heal without incident. The CAM walker (boot) is for your comfort.    Elevate the foot whenever you can. Apply ice for 15-20 minutes 2-4 times each day. Use ibuprofen and/or acetaminophen as needed for pain.  Begin using the CAM and the crutches and do not put weight on the foot for about a week. As your pain begins to improve, you may put more weight on it. You may advance the weight bearing as you can tolerate without pain.

## 2016-01-14 ENCOUNTER — Telehealth: Payer: Self-pay

## 2016-01-14 NOTE — Telephone Encounter (Signed)
Printed/signed letter is in my box in the provider lounge ready to be faxed. It is on a yellow piece of paper.
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
## 2016-01-14 NOTE — Telephone Encounter (Signed)
Doesn't remember the details of she was told at her visit. Having increased pain. Was not using the crutches at home, but has resumed. Is concerned that this is going to have very long term effects on her very active lifestyle. Difficulty with driving. Has been working from home. Has missed some days. Needs a note for work to allow her to work from home due to the inability to drive. Will plan to follow-up 3/01 at our walk-in clinic.  fax letter to [employer]. She will call back with the number. Letter printed at [phone].

## 2016-01-14 NOTE — Telephone Encounter (Signed)
Pt called back to give the fax number for the letter [phone]

## 2016-01-15 ENCOUNTER — Telehealth: Payer: Self-pay

## 2016-01-15 MED ORDER — TRAMADOL HCL 50 MG PO TABS: 50.0000 mg | ORAL_TABLET | Freq: Three times a day (TID) | ORAL | Status: AC | PRN

## 2016-01-15 NOTE — Telephone Encounter (Signed)
Ah, looks like I forgot that she had said that-we focused mostly on the limitations for work. Thank you.  Meds ordered this encounter  Medications  . traMADol (ULTRAM) 50 MG tablet    Sig: Take 1-2 tablets (50-100 mg total) by mouth every 8 (eight) hours as needed for moderate pain or severe pain.    Dispense:  30 tablet    Refill:  0    Order Specific Question:  Supervising Provider    Answer:  DOOLITTLE, ROBERT P [[id number]]

## 2016-01-15 NOTE — Telephone Encounter (Signed)
Pt is checking on status of her return to work note and is wanting to know if a pain medication is going to be called for her   Best number [phone]  Fax number for work note is in previous message

## 2016-01-15 NOTE — Telephone Encounter (Signed)
LM for pt already (see notes on 2/21 ph mes) to advise letter was faxed. Chelle, had you discussed pain med for pt? This is what you wrote on OV notes: "Use ibuprofen and/or acetaminophen as needed for pain.".

## 2016-01-15 NOTE — Telephone Encounter (Signed)
Faxed letter w/confirmation to pt's employer, at name/number below. Notified pt done on VM.

## 2016-01-16 NOTE — Telephone Encounter (Signed)
Please see previous message

## 2016-01-17 NOTE — Telephone Encounter (Signed)
Spoke to [place]. She is going to call in Tramadol. Left VM informing pt she can pick up rx from pharmacy today.

## 2016-01-27 ENCOUNTER — Ambulatory Visit (INDEPENDENT_AMBULATORY_CARE_PROVIDER_SITE_OTHER): Payer: BC Managed Care – PPO

## 2016-01-27 ENCOUNTER — Ambulatory Visit (INDEPENDENT_AMBULATORY_CARE_PROVIDER_SITE_OTHER): Payer: BC Managed Care – PPO | Admitting: Family Medicine

## 2016-01-27 VITALS — BP 126/80 | HR 99 | Temp 98.1°F | Resp 20 | Ht 67.0 in | Wt 184.0 lb

## 2016-01-27 DIAGNOSIS — S8261XD Displaced fracture of lateral malleolus of right fibula, subsequent encounter for closed fracture with routine healing: Secondary | ICD-10-CM

## 2016-01-27 DIAGNOSIS — J302 Other seasonal allergic rhinitis: Secondary | ICD-10-CM

## 2016-01-27 DIAGNOSIS — M79671 Pain in right foot: Secondary | ICD-10-CM

## 2016-01-27 MED ORDER — FLUTICASONE PROPIONATE 50 MCG/ACT NA SUSP: 2.0000 | Freq: Every day | NASAL | Status: AC

## 2016-01-27 NOTE — Patient Instructions (Addendum)
Because you received an x-ray today, you will receive an invoice from [facility]. Please contact [facility] at [phone] with questions or concerns regarding your invoice. Our billing staff will not be able to assist you with those questions.  Ok to continue walking boot and crutches until seen by orthopaedics. We will refer you. Ibuprofen or tramadol only if needed.   Return to the clinic or go to the nearest emergency room if any of your symptoms worsen or new symptoms occur.
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
## 2016-01-31 ENCOUNTER — Telehealth: Payer: Self-pay

## 2016-01-31 NOTE — Telephone Encounter (Signed)
Patient needs copies of the last two xrays taken. Her appointment with the specialist is Wednesday and she needs the disc by Monday.  [phone]

## 2016-02-14 ENCOUNTER — Ambulatory Visit (INDEPENDENT_AMBULATORY_CARE_PROVIDER_SITE_OTHER): Payer: BC Managed Care – PPO | Admitting: Physician Assistant

## 2016-02-14 ENCOUNTER — Ambulatory Visit (INDEPENDENT_AMBULATORY_CARE_PROVIDER_SITE_OTHER): Payer: BC Managed Care – PPO

## 2016-02-14 VITALS — BP 108/74 | HR 102 | Temp 99.2°F | Resp 16 | Ht 68.0 in | Wt 182.0 lb

## 2016-02-14 DIAGNOSIS — M546 Pain in thoracic spine: Secondary | ICD-10-CM

## 2016-02-14 DIAGNOSIS — R0789 Other chest pain: Secondary | ICD-10-CM | POA: Diagnosis not present

## 2016-02-14 DIAGNOSIS — R0981 Nasal congestion: Secondary | ICD-10-CM | POA: Diagnosis not present

## 2016-02-14 LAB — POCT CBC
GRANULOCYTE PERCENT: 82.1 % — AB (ref 37–80)
HEMATOCRIT: 41.1 % (ref 37.7–47.9)
Hemoglobin: 14.6 g/dL (ref 12.2–16.2)
Lymph, poc: 0.8 (ref 0.6–3.4)
MCH, POC: 31.6 pg — AB (ref 27–31.2)
MCHC: 35.6 g/dL — AB (ref 31.8–35.4)
MCV: 88.7 fL (ref 80–97)
MID (cbc): 0.5 (ref 0–0.9)
MPV: 7.7 fL (ref 0–99.8)
POC GRANULOCYTE: 6.1 (ref 2–6.9)
POC LYMPH %: 11.4 % (ref 10–50)
POC MID %: 6.5 % (ref 0–12)
Platelet Count, POC: 293 10*3/uL (ref 142–424)
RBC: 4.63 M/uL (ref 4.04–5.48)
RDW, POC: 12.2 %
WBC: 7.4 10*3/uL (ref 4.6–10.2)

## 2016-02-14 MED ORDER — CYCLOBENZAPRINE HCL 10 MG PO TABS: 10.0000 mg | ORAL_TABLET | Freq: Three times a day (TID) | ORAL | Status: AC | PRN

## 2016-02-14 MED ORDER — NAPROXEN 500 MG PO TABS: 500.0000 mg | ORAL_TABLET | Freq: Two times a day (BID) | ORAL | Status: AC

## 2016-02-14 NOTE — Patient Instructions (Signed)
     IF you received an x-ray today, you will receive an invoice from [facility]. Please contact [facility] at [phone] with questions or concerns regarding your invoice.   IF you received labwork today, you will receive an invoice from [facility]. Please contact [facility] at [phone] with questions or concerns regarding your invoice.   Our billing staff will not be able to assist you with questions regarding bills from these companies.  You will be contacted with the lab results as soon as they are available. The fastest way to get your results is to activate your My Chart account. Instructions are located on the last page of this paperwork. If you have not heard from us regarding the results in 2 weeks, please contact this office.      
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
## 2017-02-04 ENCOUNTER — Other Ambulatory Visit: Payer: Self-pay | Admitting: Physician Assistant

## 2017-02-04 DIAGNOSIS — J302 Other seasonal allergic rhinitis: Secondary | ICD-10-CM

## 2017-10-04 IMAGING — CR DG ANKLE COMPLETE 3+V*R*
4 series · 4 of 4 positions shown · non-contrast
Comparison: None.

CLINICAL DATA: Twisting injury right ankle. Swelling and bruising
laterally.

EXAM:
RIGHT ANKLE - COMPLETE 3+ VIEW

[AP]
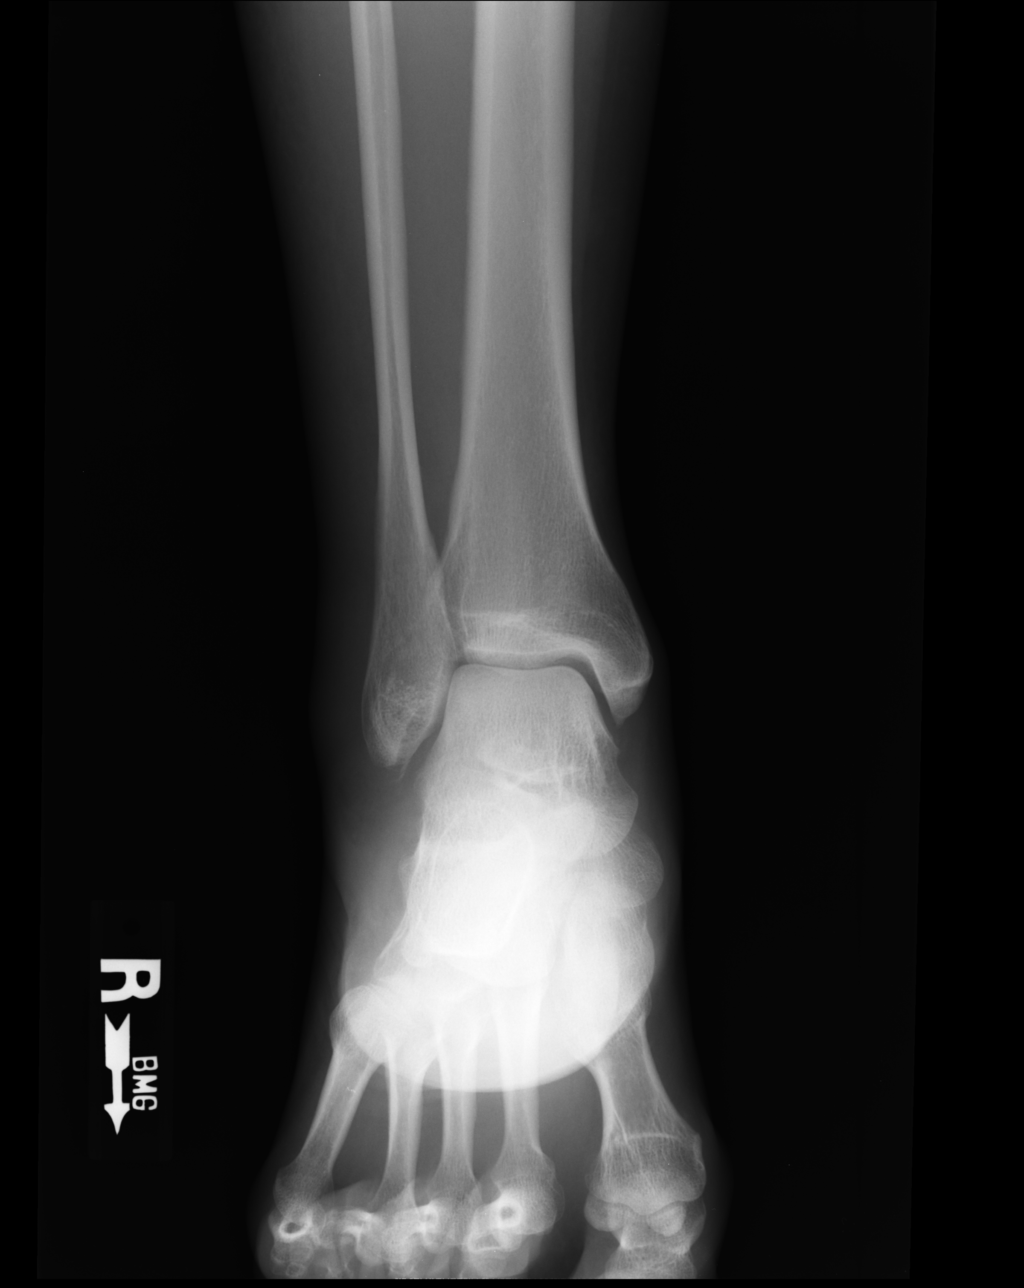

[ap obl int rot]
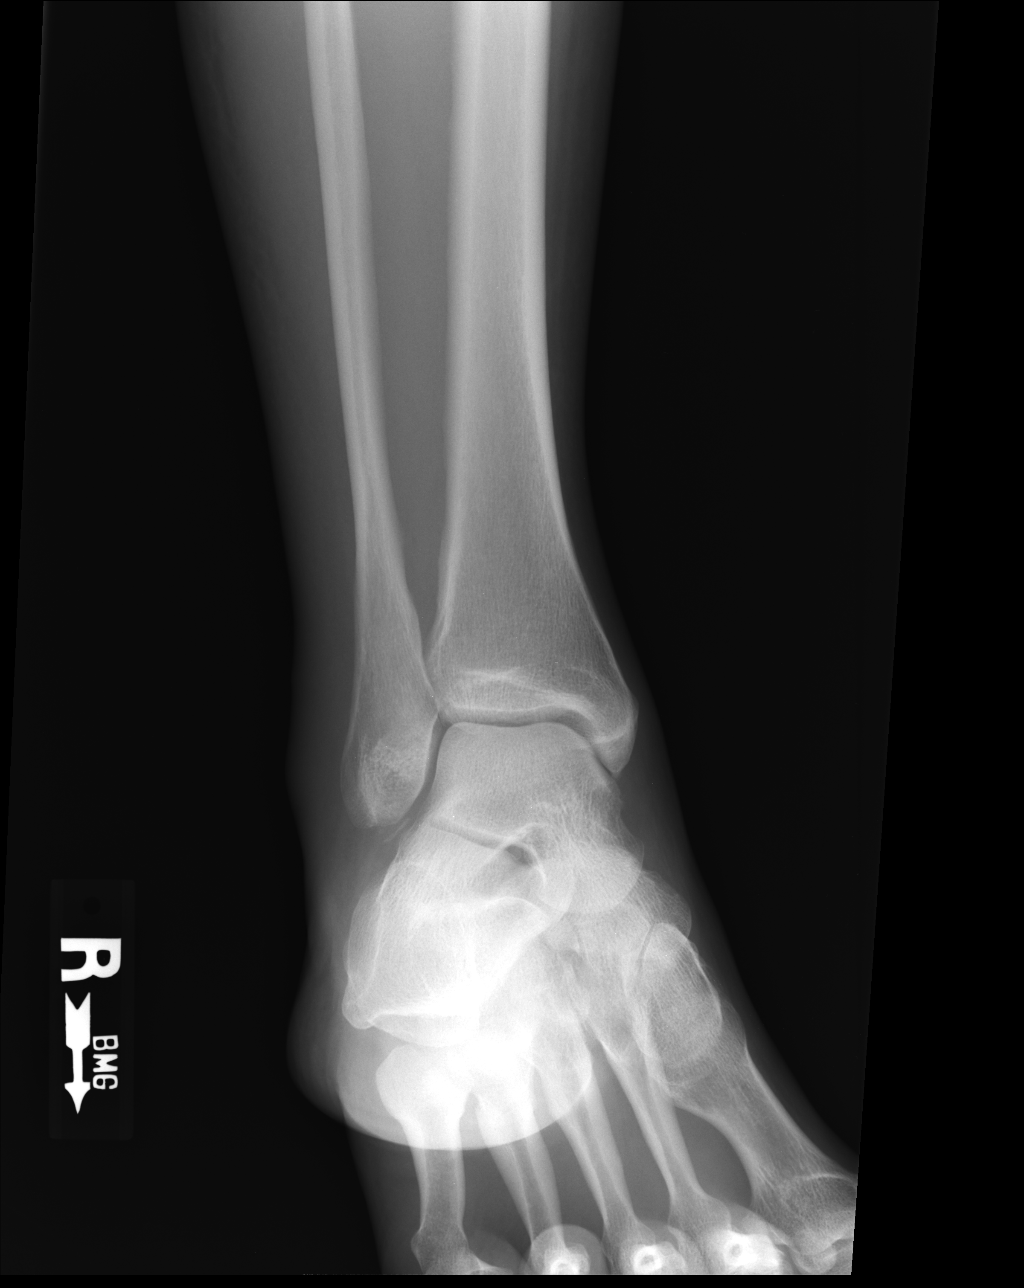

[medial obl]
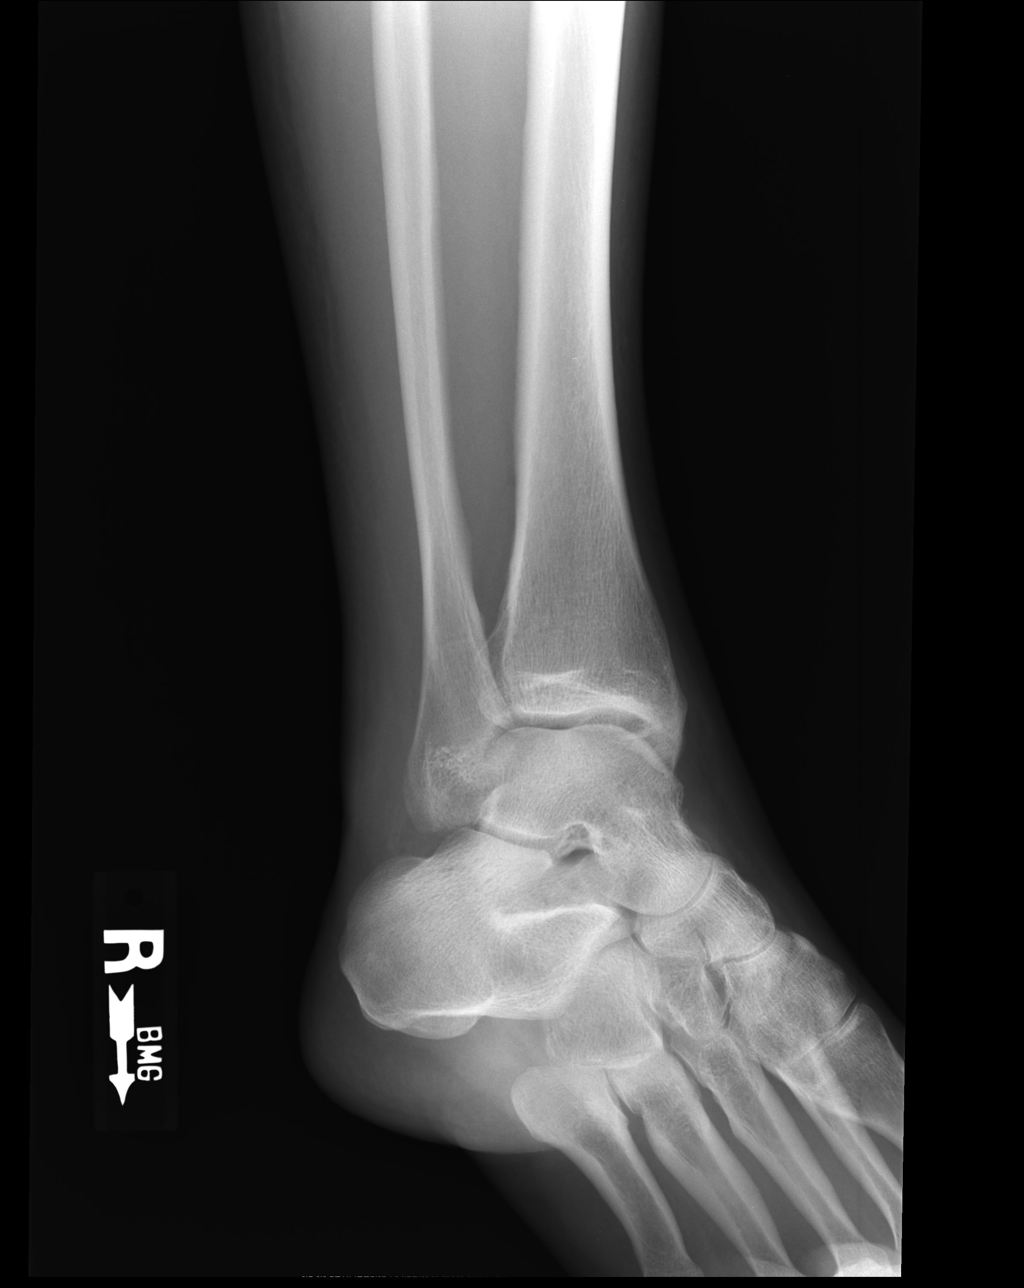

[lateral]
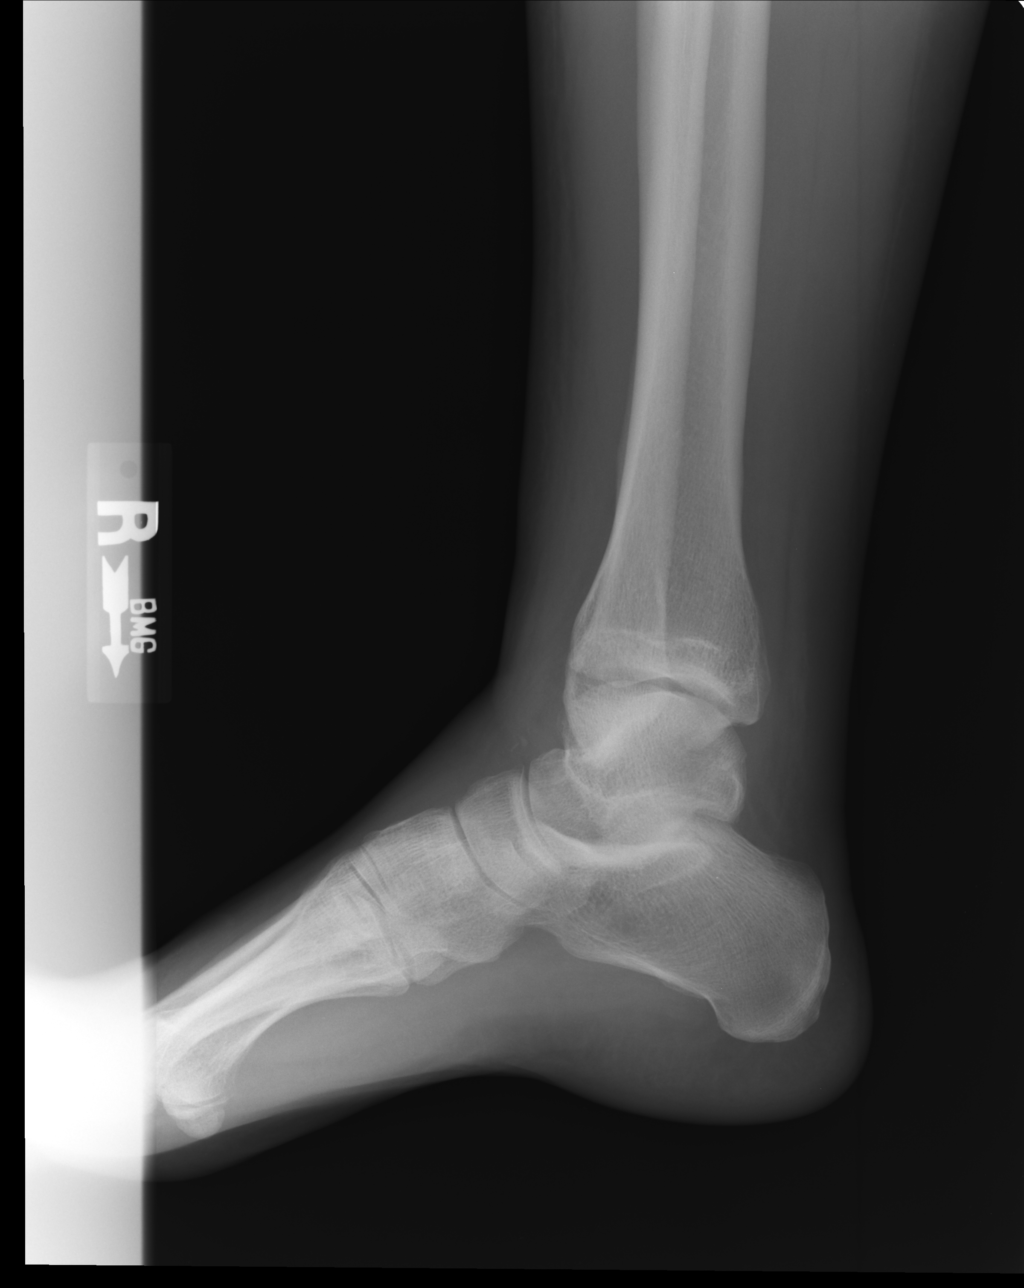

[4 of 4 positions shown; findings below may reference images not displayed]

FINDINGS: Exam demonstrates soft tissue swelling over the lateral ankle. There
is a thin 7 mm fragment immediately distal to the distal fibula
which may represent an avulsion fracture. Ankle mortise is normal.
Remainder of the exam is within normal.
IMPRESSION: Findings suggesting an avulsion fracture of the distal tip of the
fibula with associated soft tissue swelling over the lateral ankle.

## 2017-11-26 ENCOUNTER — Ambulatory Visit: Payer: BC Managed Care – PPO | Admitting: Physician Assistant
